# Patient Record
Sex: Male | Born: 1980 | Race: White | Hispanic: Yes | Marital: Single | Smoking: Current some day smoker
Health system: Southern US, Community
[De-identification: ages and names within clinical notes are randomized; demographics above are authoritative.]

---

## 2005-11-03 ENCOUNTER — Emergency Department (HOSPITAL_COMMUNITY): Admission: EM | Admit: 2005-11-03 | Discharge: 2005-11-03 | Payer: Self-pay | Admitting: Emergency Medicine

## 2007-01-12 IMAGING — CR DG HAND COMPLETE 3+V*R*
3 series · 3 of 3 positions shown · non-contrast
Comparison: none

CLINICAL DATA: Penetrating trauma to right hand.  Pain. 
RIGHT HAND ? 3 VIEW:

[view not recorded (1 of 3)]
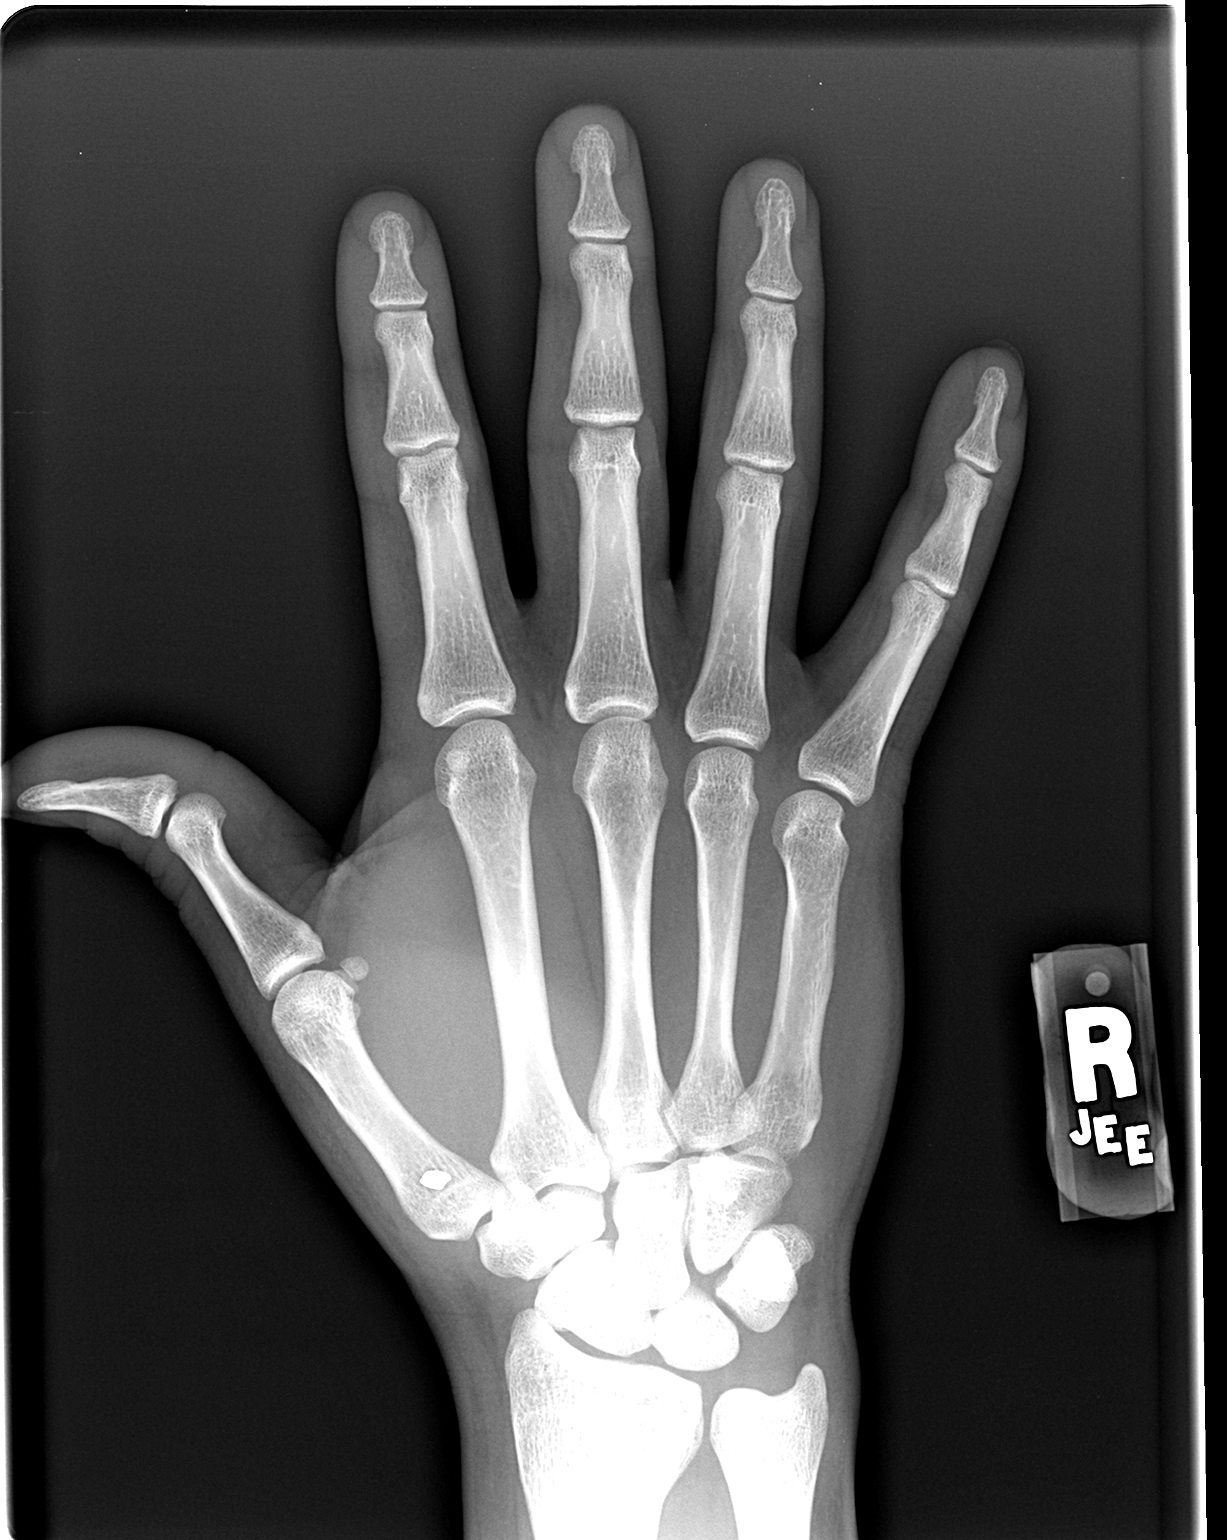

[view not recorded (2 of 3)]
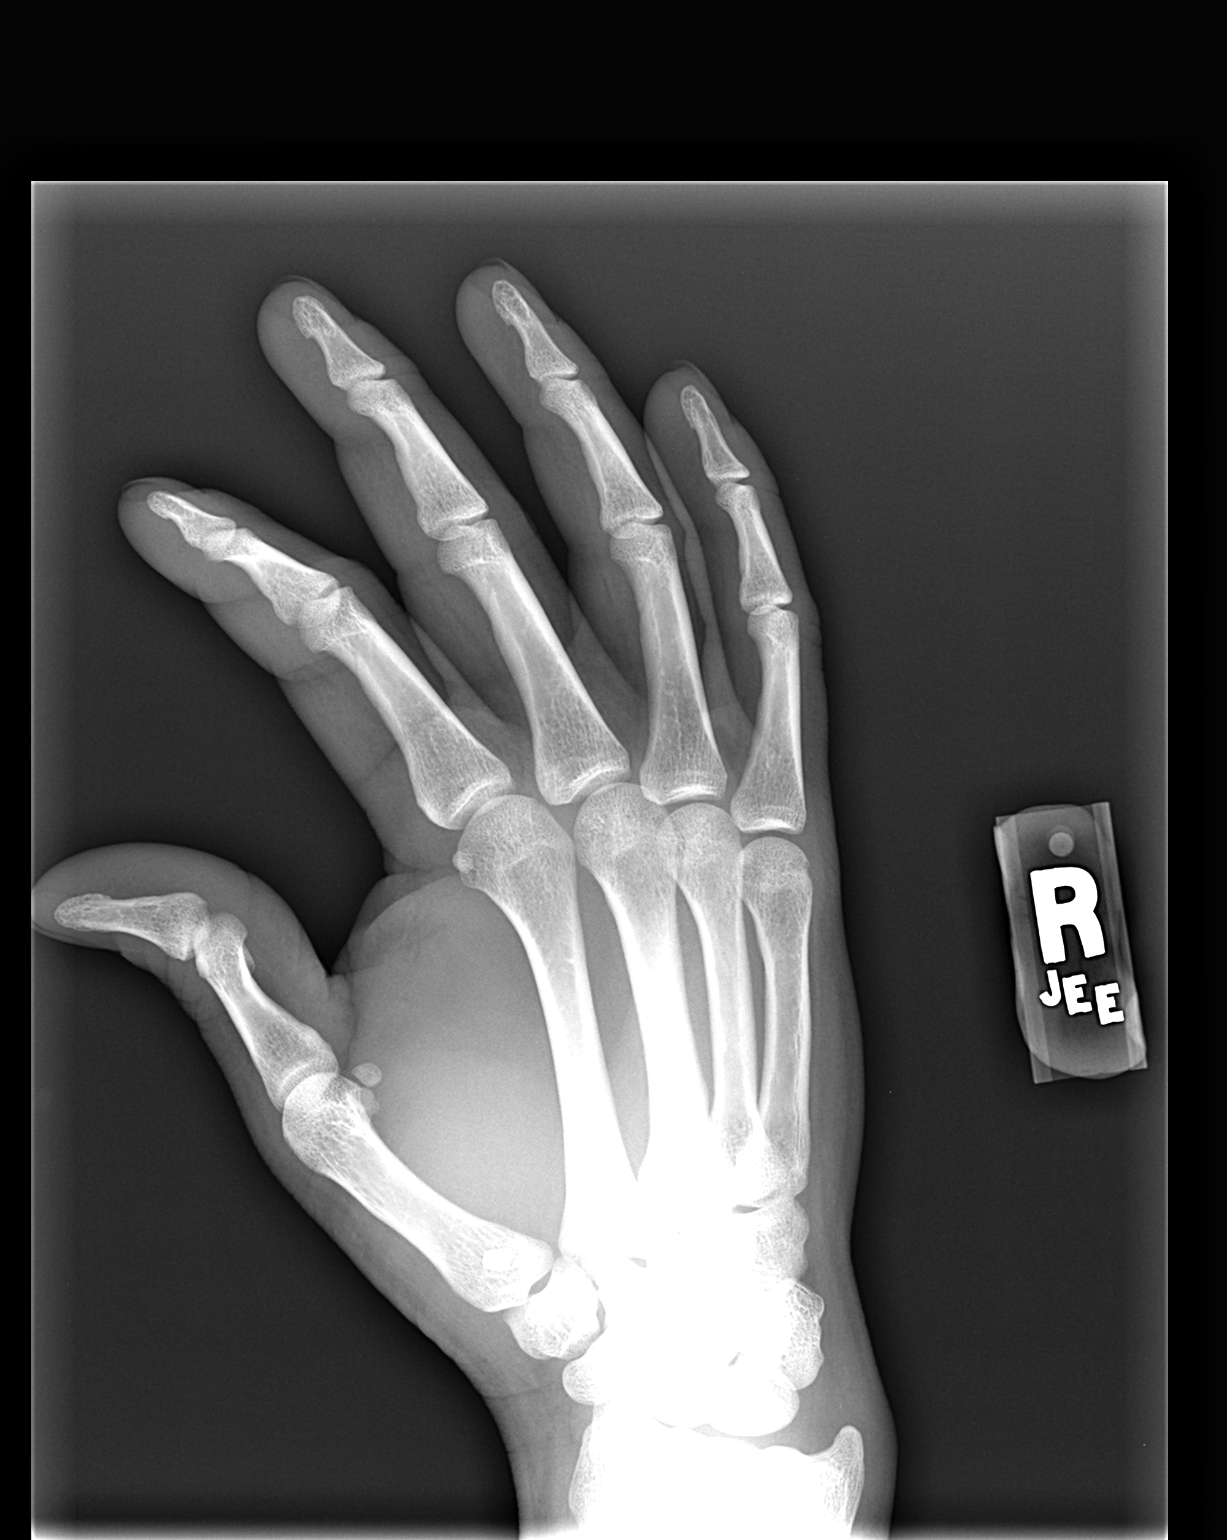

[view not recorded (3 of 3)]
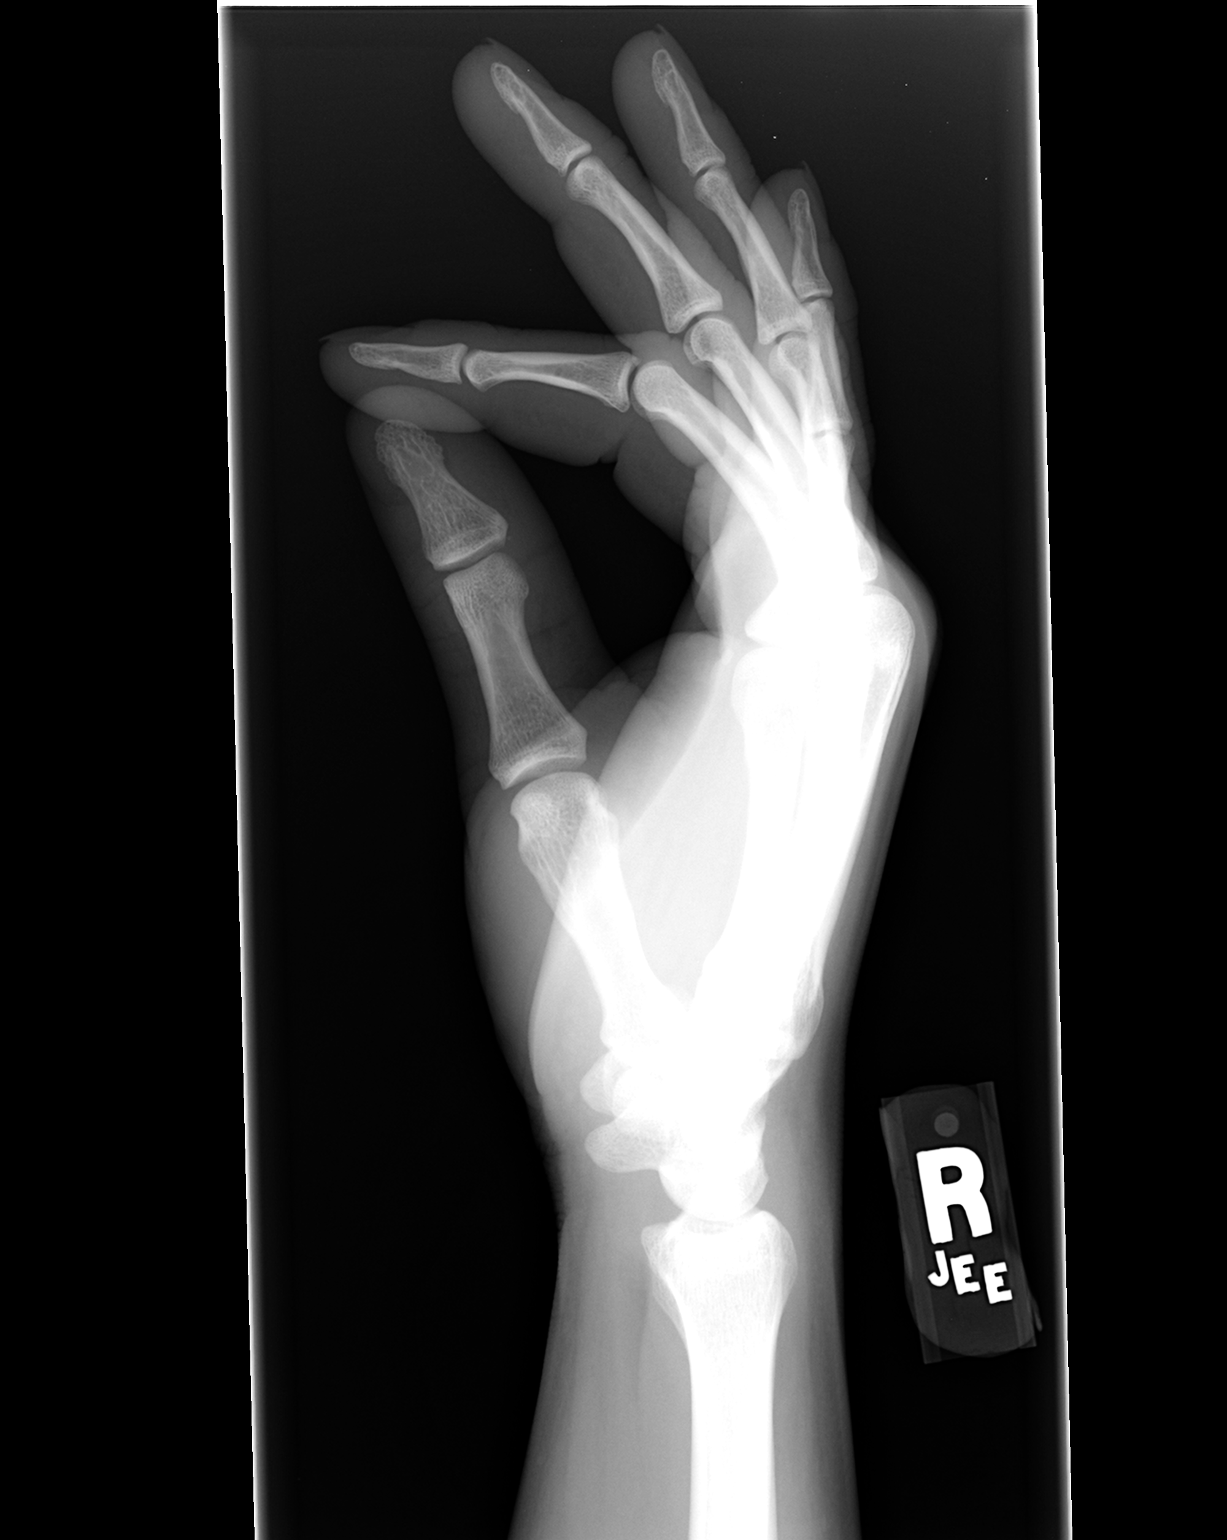

[3 of 3 positions shown; findings below may reference images not displayed]

FINDINGS: A small metallic fragment measuring 5 mm is seen in the soft tissues along the dorsal aspect of the proximal first metacarpal.  There is no evidence of fracture, dislocation, or other bone abnormality.
IMPRESSION: 5 mm metallic foreign body in the soft tissues along the dorsal aspect of the proximal first metacarpal.  No evidence of fracture.

## 2017-10-16 ENCOUNTER — Ambulatory Visit (HOSPITAL_COMMUNITY)
Admission: EM | Admit: 2017-10-16 | Discharge: 2017-10-16 | Disposition: A | Discharge status: Home / Self Care | Payer: Self-pay | Attending: Internal Medicine | Admitting: Internal Medicine

## 2017-10-16 ENCOUNTER — Encounter (HOSPITAL_COMMUNITY): Payer: Self-pay | Admitting: *Deleted

## 2017-10-16 ENCOUNTER — Other Ambulatory Visit: Payer: Self-pay

## 2017-10-16 DIAGNOSIS — H60502 Unspecified acute noninfective otitis externa, left ear: Secondary | ICD-10-CM

## 2017-10-16 MED ORDER — OFLOXACIN 0.3 % OP SOLN: OPHTHALMIC | 0 refills | Status: AC

## 2017-10-16 NOTE — Discharge Instructions (Signed)
Use ofloxacin drops to the left ear as directed. Avoid using cotton swabs. Follow up with PCP or here if symptoms do not improve or worsens.

## 2017-10-16 NOTE — ED Provider Notes (Signed)
[facility]    CSN: [id number] Arrival date & time: 10/16/17  1225     History   Chief Complaint Chief Complaint  Patient presents with  . Otalgia    HPI Travis Rivera is a 36 y.o. male.   36 year old male comes in for 2 day history of left ear pain and swelling. Has also had some ear drainage with muffled hearing. Has been applying otc ear ache drops without relief. Ibuprofen without relief. Denies fever, chills, night sweats. He uses cotton swabs to clean the ear. Denies trauma, recent swimming, recent travels. Denies URI symptoms such as cough, congestion, sore throat.       History reviewed. No pertinent past medical history.  There are no active problems to display for this patient.   History reviewed. No pertinent surgical history.     Home Medications    Prior to Admission medications   Medication Sig Start Date End Date Taking? Authorizing Provider  ofloxacin (OCUFLOX) 0.3 % ophthalmic solution 10 drops in left ear once daily for 7 days. 10/16/17   Belinda FisherYu, Kelda Azad V, PA-C    Family History No family history on file.  Social History Social History   Tobacco Use  . Smoking status: Current Some Day Smoker  Substance Use Topics  . Alcohol use: Yes    Comment: occasionally  . Drug use: No     Allergies   Patient has no known allergies.   Review of Systems Review of Systems  Reason unable to perform ROS: See HPI as above.     Physical Exam Triage Vital Signs ED Triage Vitals [10/16/17 1349]  Enc Vitals Group     BP (!) 148/78     Pulse Rate 88     Resp 16     Temp 97.8 F (36.6 C)     Temp Source Oral     SpO2 97 %     Weight      Height      Head Circumference      Peak Flow      Pain Score 8     Pain Loc      Pain Edu?      Excl. in GC?    No data found.  Updated Vital Signs BP (!) 148/78   Pulse 88   Temp 97.8 F (36.6 C) (Oral)   Resp 16   SpO2 97%   Physical Exam  Constitutional: He is oriented to  person, place, and time. He appears well-developed and well-nourished. No distress.  HENT:  Head: Normocephalic and atraumatic.  Right Ear: Tympanic membrane, external ear and ear canal normal. Tympanic membrane is not erythematous and not bulging.  Left ear with tenderness to palpation of tragus. Ear canal swelling with drainage. Difficult exam and unable to see full view of TM. Unsure of perforation.   Eyes: Conjunctivae are normal. Pupils are equal, round, and reactive to light.  Neck: Normal range of motion. Neck supple.  Neurological: He is alert and oriented to person, place, and time.     UC Treatments / Results  Labs (all labs ordered are listed, but only abnormal results are displayed) Labs Reviewed - No data to display  EKG  EKG Interpretation None       Radiology No results found.  Procedures Procedures (including critical care time)  Medications Ordered in UC Medications - No data to display   Initial Impression / Assessment and Plan / UC Course  I have  reviewed the triage vital signs and the nursing notes.  Pertinent labs & imaging results that were available during my care of the patient were reviewed by me and considered in my medical decision making (see chart for details).    Ear wick applied. Given unable to fully view TM, will put patient on ofloxacin drops. NSAIDs for pain. Return precautions given.   Final Clinical Impressions(s) / UC Diagnoses   Final diagnoses:  Acute otitis externa of left ear, unspecified type    ED Discharge Orders        Ordered    ofloxacin (OCUFLOX) 0.3 % ophthalmic solution     10/16/17 1434        Belinda FisherYu, Makynli Stills V, PA-C 10/16/17 1505

## 2017-10-16 NOTE — ED Triage Notes (Signed)
C/O left ear pain and swelling x 2 days.  Has been taking IBU.  Unsure if fevers.
